# Patient Record
Sex: Male | Born: 2000 | Race: White | Hispanic: No | Marital: Single | Smoking: Never smoker
Health system: Southern US, Community
[De-identification: ages and names within clinical notes are randomized; demographics above are authoritative.]

---
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
## 2022-04-01 IMAGING — DX DG ANKLE COMPLETE 3+V*L*
3 series · 3 of 3 positions shown · non-contrast
Comparison: None.

CLINICAL DATA: Left ankle pain after injury last night.

EXAM:
LEFT ANKLE COMPLETE - 3+ VIEW

[ankle ap]
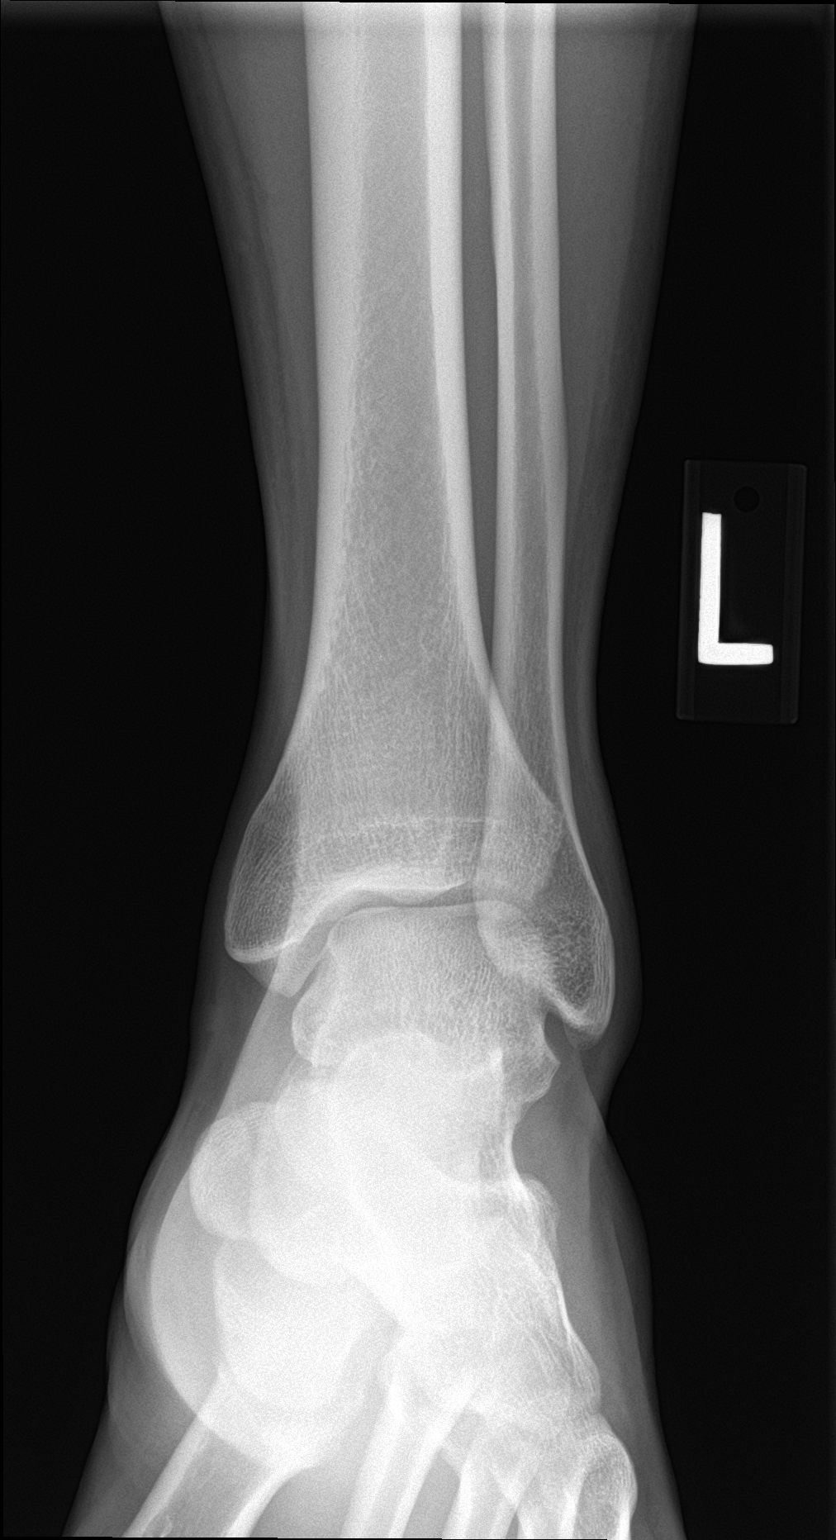

[ankle obl]
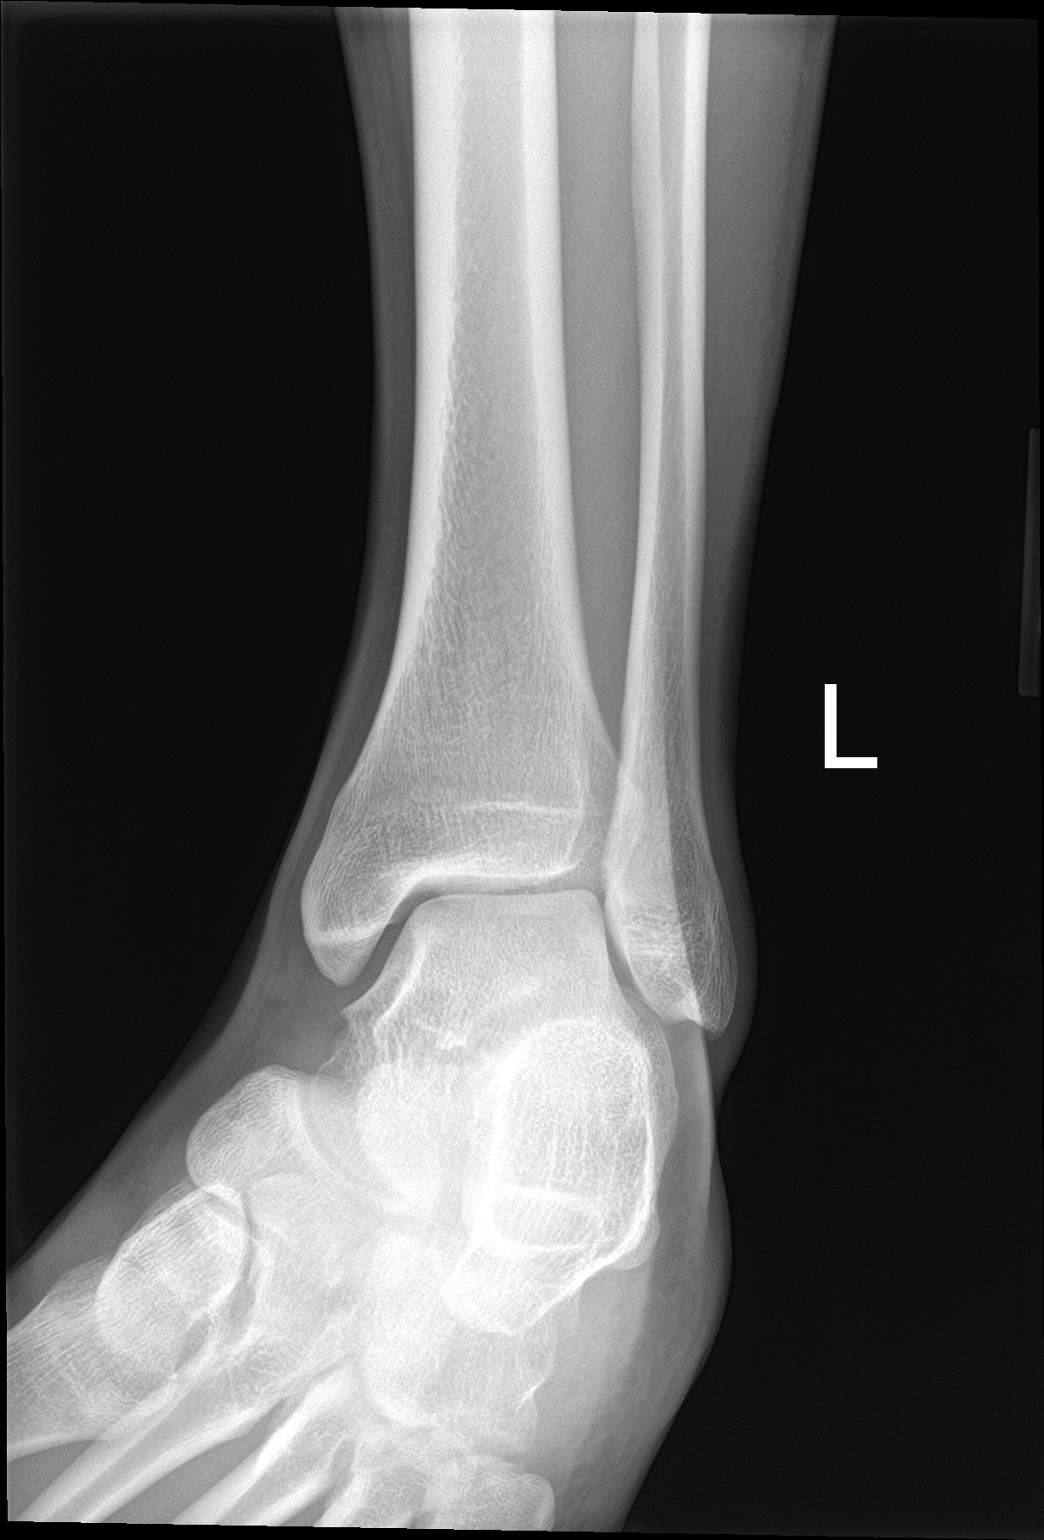

[ankle lat]
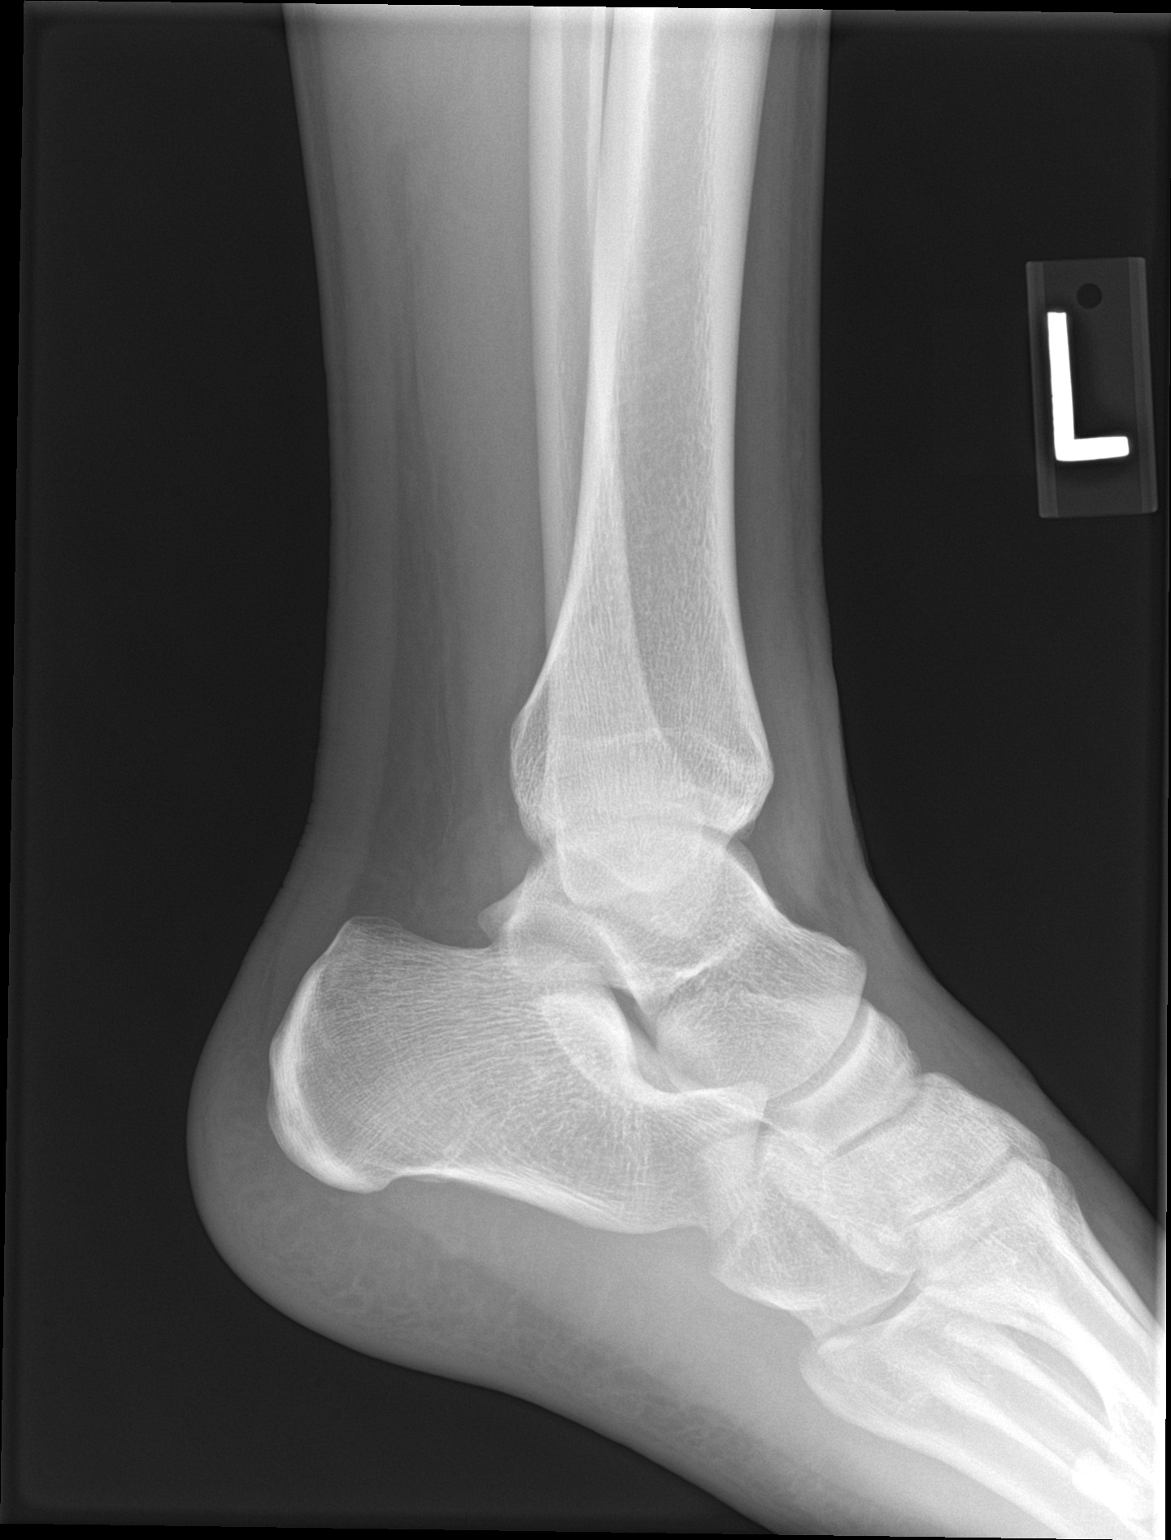

[3 of 3 positions shown; findings below may reference images not displayed]

FINDINGS: There is no evidence of fracture, dislocation, or joint effusion.
There is no evidence of arthropathy or other focal bone abnormality.
Soft tissues are unremarkable.
IMPRESSION: Negative.

## 2022-12-08 ENCOUNTER — Ambulatory Visit
Admission: RE | Admit: 2022-12-08 | Discharge: 2022-12-08 | Disposition: A | Discharge status: Home / Self Care | Payer: BC Managed Care – PPO | Source: Ambulatory Visit | Attending: Family Medicine | Admitting: Family Medicine

## 2022-12-08 VITALS — BP 125/79 | HR 76 | Temp 98.6°F | Resp 18 | Ht 69.0 in | Wt 165.0 lb

## 2022-12-08 DIAGNOSIS — H9202 Otalgia, left ear: Secondary | ICD-10-CM

## 2022-12-08 MED ORDER — NEOMYCIN-POLYMYXIN-HC 3.5-10000-1 OT SUSP: 4.0000 [drp] | Freq: Three times a day (TID) | OTIC | 0 refills | Status: AC

## 2022-12-08 MED ORDER — AMOXICILLIN 875 MG PO TABS: 875.0000 mg | ORAL_TABLET | Freq: Two times a day (BID) | ORAL | 0 refills | Status: AC

## 2022-12-08 NOTE — ED Provider Notes (Signed)
[facility]    CSN: [id number] Arrival date & time: 12/08/22  1030      History   Chief Complaint Chief Complaint  Patient presents with   Otalgia    HPI Jose Weber is a 21 y.o. male.   HPI  Patient states usually healthy on no medication.  He has pain in his left ear for 4 days.  Some congestion.  Mild headache.  No runny stuffy nose.  No sore throat or fever.  No one else at home is sick  History reviewed. No pertinent past medical history.  There are no problems to display for this patient.   History reviewed. No pertinent surgical history.     Home Medications    Prior to Admission medications   Medication Sig Start Date End Date Taking? Authorizing Provider  amoxicillin (AMOXIL) 875 MG tablet Take 1 tablet (875 mg total) by mouth 2 (two) times daily. 12/08/22  Yes Eustace Moore, MD  neomycin-polymyxin-hydrocortisone (CORTISPORIN) 3.5-10000-1 OTIC suspension Place 4 drops into the left ear 3 (three) times daily. 12/08/22  Yes Eustace Moore, MD  ibuprofen (ADVIL) 600 MG tablet Take 1 tablet (600 mg total) by mouth every 6 (six) hours as needed. 03/20/21   Eustace Moore, MD    Family History Family History  Family history unknown: Yes    Social History Social History   Tobacco Use   Smoking status: Never   Smokeless tobacco: Never  Vaping Use   Vaping Use: Never used  Substance Use Topics   Alcohol use: Not Currently   Drug use: Never     Allergies   Patient has no known allergies.   Review of Systems Review of Systems  See HPI Physical Exam Triage Vital Signs ED Triage Vitals  Enc Vitals Group     BP 12/08/22 1050 125/79     Pulse Rate 12/08/22 1050 76     Resp 12/08/22 1050 18     Temp 12/08/22 1050 98.6 F (37 C)     Temp Source 12/08/22 1050 Oral     SpO2 12/08/22 1050 99 %     Weight 12/08/22 1052 165 lb (74.8 kg)     Height 12/08/22 1052 5\' 9"  (1.753 m)     Head Circumference --      Peak Flow --       Pain Score 12/08/22 1052 1     Pain Loc --      Pain Edu? --      Excl. in GC? --    No data found.  Updated Vital Signs BP 125/79 (BP Location: Right Arm)   Pulse 76   Temp 98.6 F (37 C) (Oral)   Resp 18   Ht 5\' 9"  (1.753 m)   Wt 74.8 kg   SpO2 99%   BMI 24.37 kg/m   Physical Exam Constitutional:      General: He is not in acute distress.    Appearance: He is well-developed.  HENT:     Head: Normocephalic and atraumatic.     Left Ear: There is impacted cerumen.     Ears:     Comments: Ear canals are curved.  Somewhat difficult to examine TM.  Right TM appears dull.  Left TM is obscured by cerumen.  Attempt at irrigation unsuccessful secondary to pain Eyes:     Conjunctiva/sclera: Conjunctivae normal.     Pupils: Pupils are equal, round, and reactive to light.  Cardiovascular:  Rate and Rhythm: Normal rate.  Pulmonary:     Effort: Pulmonary effort is normal. No respiratory distress.  Abdominal:     General: There is no distension.     Palpations: Abdomen is soft.  Musculoskeletal:        General: Normal range of motion.     Cervical back: Normal range of motion.  Skin:    General: Skin is warm and dry.  Neurological:     Mental Status: He is alert.      UC Treatments / Results  Labs (all labs ordered are listed, but only abnormal results are displayed) Labs Reviewed - No data to display  EKG   Radiology No results found.  Procedures Procedures (including critical care time)  Medications Ordered in UC Medications - No data to display  Initial Impression / Assessment and Plan / UC Course  I have reviewed the triage vital signs and the nursing notes.  Pertinent labs & imaging results that were available during my care of the patient were reviewed by me and considered in my medical decision making (see chart for details).      Final Clinical Impressions(s) / UC Diagnoses   Final diagnoses:  Otalgia of left ear     Discharge  Instructions      Use the eardrops 3-4 times a day Take amoxicillin 2 times a day If your ear is not improved, you may make an appointment to return or see an ENT     ED Prescriptions     Medication Sig Dispense Auth. Provider   amoxicillin (AMOXIL) 875 MG tablet Take 1 tablet (875 mg total) by mouth 2 (two) times daily. 14 tablet Eustace Moore, MD   neomycin-polymyxin-hydrocortisone (CORTISPORIN) 3.5-10000-1 OTIC suspension Place 4 drops into the left ear 3 (three) times daily. 10 mL Eustace Moore, MD      PDMP not reviewed this encounter.   Eustace Moore, MD 12/08/22 1124

## 2022-12-08 NOTE — ED Triage Notes (Signed)
Patient c/o possible ear infection in his left ear x 4 days.  Some headache, patient does have some fluid in his ear.  Denies any OTC pain meds.

## 2022-12-08 NOTE — Discharge Instructions (Signed)
Use the eardrops 3-4 times a day Take amoxicillin 2 times a day If your ear is not improved, you may make an appointment to return or see an ENT
